# Patient Record
Sex: Female | Born: 1985 | Race: White | Hispanic: No | Marital: Single | Smoking: Never smoker
Health system: Southern US, Community
[De-identification: ages and names within clinical notes are randomized; demographics above are authoritative.]

## PROBLEM LIST (undated history)

## (undated) DIAGNOSIS — N3010 Interstitial cystitis (chronic) without hematuria: Secondary | ICD-10-CM

## (undated) HISTORY — PX: CYSTOSCOPY: SUR368

---
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
## 2003-09-07 ENCOUNTER — Other Ambulatory Visit: Admission: RE | Admit: 2003-09-07 | Discharge: 2003-09-07 | Payer: Self-pay | Admitting: Family Medicine

## 2006-01-21 ENCOUNTER — Ambulatory Visit: Payer: Self-pay | Admitting: Internal Medicine

## 2007-02-03 ENCOUNTER — Encounter: Admission: RE | Admit: 2007-02-03 | Discharge: 2007-02-03 | Payer: Self-pay

## 2007-09-05 ENCOUNTER — Ambulatory Visit (HOSPITAL_BASED_OUTPATIENT_CLINIC_OR_DEPARTMENT_OTHER): Admission: RE | Admit: 2007-09-05 | Discharge: 2007-09-05 | Payer: Self-pay | Admitting: Urology

## 2007-09-05 ENCOUNTER — Encounter (INDEPENDENT_AMBULATORY_CARE_PROVIDER_SITE_OTHER): Payer: Self-pay | Admitting: Urology

## 2010-07-10 ENCOUNTER — Encounter (INDEPENDENT_AMBULATORY_CARE_PROVIDER_SITE_OTHER): Payer: Self-pay | Admitting: *Deleted

## 2010-07-17 ENCOUNTER — Encounter (INDEPENDENT_AMBULATORY_CARE_PROVIDER_SITE_OTHER): Payer: Self-pay | Admitting: *Deleted

## 2010-07-17 ENCOUNTER — Ambulatory Visit: Payer: Self-pay | Admitting: Internal Medicine

## 2010-07-17 DIAGNOSIS — N3010 Interstitial cystitis (chronic) without hematuria: Secondary | ICD-10-CM

## 2010-07-17 DIAGNOSIS — K5900 Constipation, unspecified: Secondary | ICD-10-CM | POA: Insufficient documentation

## 2010-07-17 DIAGNOSIS — K6289 Other specified diseases of anus and rectum: Secondary | ICD-10-CM

## 2010-11-28 NOTE — Assessment & Plan Note (Signed)
Summary: RECTAL PAIN...AS.   History of Present Illness Visit Type: Follow-up Visit Primary GI MD: Stan Head MD [facility] Chief Complaint: Ongoing rectal pain and constipation with hard stools. Pt denies any bleeding. Pt has increased fiber in her diet.  History of Present Illness:   25 yo Asian woman. Previously seen for IBS-like problems in 2007. She saw Dr. Loreta Ave in July, had a very painful rectal exam, discussed diet. She was prescribed diltiazem gel 2% but not helpful at all and stopped after 2 weeks. She increased fiber and fluid but has not been perfectly compliant and still gets constipated some. She has painful defecation, sometimes sharp, sometines contraction and pressure of anorectum. Sitting down can be painful. She will see some thin filmy fluid discharge in underwear and can feel it at times. No bleeding. It is not disturbing sleep. She defecates every other day, sometinmes hard and sometimes not. 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 Ongoing rectal pain and constipation with hard stools. Pt denies any bleeding. Pt has increased fiber in her diet.    GI Review of Systems    Reports belching.      Denies abdominal pain, acid reflux, bloating, chest pain, dysphagia with liquids, dysphagia with solids, heartburn, loss of appetite, nausea, vomiting, vomiting blood, weight loss, and  weight gain.      Reports black tarry stools, constipation, hemorrhoids, and  rectal pain.     Denies anal fissure, change in bowel habit, diarrhea, diverticulosis, fecal incontinence, heme positive stool, irritable bowel syndrome, jaundice, light color stool, liver problems, and  rectal bleeding. Preventive Screening-Counseling & Management  Alcohol-Tobacco     Smoking Status: never      Drug Use:  no.      Current Medications (verified): 1)  Multivitamins   Tabs (Multiple Vitamin) .... One Tablet By Mouth Once Daily 2)  Fish Oil 1000 Mg Caps (Omega-3 Fatty  Acids) .... 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  Alcohol Use - no Illicit Drug Use - no Smoking Status:  never Drug Use:  no  Review of Systems       The patient complains of urination - excessive.  The patient denies allergy/sinus, anemia, anxiety-new, arthritis/joint pain, back pain, blood in urine, breast changes/lumps, change in vision, confusion, cough, coughing up blood, depression-new, fainting, fatigue, fever, headaches-new, hearing problems, heart murmur, heart rhythm changes, itching, menstrual pain, muscle pains/cramps, night sweats, nosebleeds, pregnancy symptoms, shortness of breath, skin rash, sleeping problems, sore throat, swelling of feet/legs, swollen lymph glands, thirst - excessive , urination - excessive , urination changes/pain, urine leakage, vision changes, and voice change.         She manages IC with with diet restriction and is having a little flare at this time.  Vital Signs:  Patient profile:   25 year old female Height:      63 inches Weight:      103.13 pounds BMI:     18.33 Pulse rate:   80 / minute Pulse rhythm:   regular BP sitting:   116 / 68  (right arm) Cuff size:   regular  Vitals Entered By: Christie Nottingham CMA Duncan Dull) (July 17, 2010 9:03 AM)  Physical Exam  General:  Well developed, well nourished, no acute distress. Eyes:  PERRLA, no icterus. Mouth:  No deformity or lesions, dentition normal. Neck:  Supple; no masses or thyromegaly. Lungs:  Clear throughout to auscultation. Heart:  Regular rate and rhythm; no murmurs, rubs,  or bruits. Rectal:  Femal staff present tiny pile anterior diffusely tender (full circumference) but mild-moderate, able to  insert 5th digit but did not tolerate index finger  no perianal dermatitis Extremities:  No clubbing, cyanosis, edema or deformities noted. Neurologic:  Alert and  oriented x3 Psych:  Alert and cooperative. Normal mood and affect.   Impression & Recommendations:  Problem # 1:  RECTAL PAIN (ZOX-096.04) Assessment New Does not sound like a severe fissure but is tender and has small anteror pile on exam. Discharge raises ? of proctitis. Plan for flex sig to assess and have a clearer dx. ? if some relationship to interstitial cystitis (doubt but could be possible). Obtain records from Dr. Loreta Ave re: this summers evaluatuon I believe she was gien good advice about increasing fiber and this was reinforced. Seems llike she does improve with fber but is not totally compliant.  Orders: Flex with Sedation (Flex w/Sed)  Problem # 2:  CONSTIPATION (ICD-564.00) Assessment: Deteriorated increase fiber as told by Dr. Loreta Ave patient says TSH was ok in July'IBS most likely, especially with concomitant IC (may need to be seen for that again)  Problem # 3:  INTERSTITIAL CYSTITIS (ICD-595.1) Assessment: New she did not have this when I saw her in 2007 No complaints consistent with fibromyalgia  Patient Instructions: 1)  We will see you at your procedure on 08/10/10 2)  Please take your Metamucil regularly 3)  We will obtain your records from Dr. Kenna Gilbert office. 4)  [facility] Patient Information Guide given to patient.  5)  Colonoscopy and Flexible Sigmoidoscopy brochure given.  6)  The medication list was reviewed and reconciled.  All changed / newly prescribed medications were explained.  A complete medication list was provided to the patient / caregiver.  Appended Document: RECTAL PAIN...AS.   Social History: Sales promotion account executive [employer] Patient has never smoked.  Alcohol Use - no Illicit Drug Use - no

## 2010-11-28 NOTE — Letter (Signed)
Summary: [facility]  [address] [place], [place] [address]   Phone: [phone]  Fax: [phone]       Rachel Beltran    05/30/1986    MRN: [id number]        Procedure Day Dorna Bloom: Lenor Coffin, 08/10/10     Arrival Time: 1:30 PM      Procedure Time: 2:30 PM    Location of Procedure:                    _X_  [facility] (4th Floor)  PREPARATION FOR FLEXIBLE SIGMOIDOSCOPY WITH FLEETS ENEMA  Prior to the day before your procedure, purchase one Fleet Enema from the laxative section of your drugstore.  _________________________________________________________________________________________________  THE DAY BEFORE YOUR PROCEDURE             WEDNESDAY, 08/09/10  1.   Have a clear liquid dinner the night before your procedure.  2.   Do not drink anything colored red or purple.  Avoid juices with pulp.  No orange juice.              CLEAR LIQUIDS INCLUDE: Water Jello Ice Popsicles Tea (sugar ok, no milk/cream) Powdered fruit flavored drinks Coffee (sugar ok, no milk/cream) Gatorade Juice: apple, white grape, white cranberry  Lemonade Clear bullion, consomm, broth Carbonated beverages (any kind) Strained chicken noodle soup Hard Candy  _________________________________________________________________________________________________  THE DAY OF YOUR PROCEDURE            THURSDAY, 08/10/10  1.   Use Fleet Enema one hour prior to coming for procedure.  2.   You may drink clear liquids until 12:30 PM (2 hours before exam)   MEDICATION INSTRUCTIONS  Unless otherwise instructed, you should take regular prescription medications with a small sip of water as early as possible the morning of your procedure.            OTHER INSTRUCTIONS  You will need a responsible adult at least 27 years of age to accompany you and drive you home.   This person must remain in the waiting room during your procedure.  Wear loose fitting  clothing that is easily removed.  Leave jewelry and other valuables at home.  However, you may wish to bring a book to read or an iPod/MP3 player to listen to music as you wait for your procedure to start.  Remove all body piercing jewelry and leave at home.  Total time from sign-in until discharge is approximately 2-3 hours.  You should go home directly after your procedure and rest.  You can resume normal activities the day after your procedure.  The day of your procedure you should not:   Drive   Make legal decisions   Operate machinery   Drink alcohol   Return to work  You will receive specific instructions about eating, activities and medications before you leave.   The above instructions have been reviewed and explained to me by   Francee Piccolo, CMA (AAMA)      I fully understand and can verbalize these instructions _______________________ Date July 17, 2010

## 2010-11-28 NOTE — Progress Notes (Signed)
Summary: [facility]  [facility]   Imported By: Lanelle Bal 08/11/2010 10:26:17  _____________________________________________________________________  External Attachment:    Type:   Image     Comment:   External Document

## 2010-11-28 NOTE — Letter (Signed)
Summary: New Patient letter  [facility]  [address] [place], [place] [address]   Phone: [phone]  Fax: [phone]       07/10/2010 MRN: [id number]  Rachel Beltran [address] [place], [place]  [address]  Dear Ms. Rachel Beltran,  Welcome to the Gastroenterology Division at [facility].    You are scheduled to see Dr. Leone Payor on 07/17/2010 at 9:00AM on the 3rd floor at [facility], [address]  We ask that you try to arrive at our office 15 minutes prior to your appointment time to allow for check-in.  We would like you to complete the enclosed self-administered evaluation form prior to your visit and bring it with you on the day of your appointment.  We will review it with you.  Also, please bring a complete list of all your medications or, if you prefer, bring the medication bottles and we will list them.  Please bring your insurance card so that we may make a copy of it.  If your insurance requires a referral to see a specialist, please bring your referral form from your primary care physician.  Co-payments are due at the time of your visit and may be paid by cash, check or credit card.     Your office visit will consist of a consult with your physician (includes a physical exam), any laboratory testing he/she may order, scheduling of any necessary diagnostic testing (e.g. x-ray, ultrasound, CT-scan), and scheduling of a procedure (e.g. Endoscopy, Colonoscopy) if required.  Please allow enough time on your schedule to allow for any/all of these possibilities.    If you cannot keep your appointment, please call [phone] to cancel or reschedule prior to your appointment date.  This allows [place] the opportunity to schedule an appointment for another patient in need of care.  If you do not cancel or reschedule by 5 p.m. the business day prior to your appointment date, you will be charged a $50.00 late cancellation/no-show fee.    Thank you  for choosing [facility] for your medical needs.  We appreciate the opportunity to care for you.  Please visit [place] at our website  to learn more about our practice.                     Sincerely,                                                             The Gastroenterology Division

## 2011-03-13 NOTE — Op Note (Signed)
[name], [name]            ACCOUNT NO.:  [id number]   MEDICAL RECORD NO.:  [id number]          PATIENT TYPE:  AMB   LOCATION:  [facility]                         FACILITY:  [facility]   PHYSICIAN:  Ronald L. Earlene Plater, M.D.  DATE OF BIRTH:  [birth year]   DATE OF PROCEDURE:  09/05/2007  DATE OF DISCHARGE:                               OPERATIVE REPORT   DIAGNOSIS:  Questionable interstitial cystitis.   OPERATIVE PROCEDURES:  1. Cystourethroscopy.  2. Dilatation of urethra.  3. Hydraulic bladder distention.  4. Bladder biopsy.   SURGEON:  Rachel Beltran. Earlene Plater, M.D.   ANESTHESIA:  LMA.   BLOOD LOSS:  Negligible.   TUBES:  None.   COMPLICATIONS:  None.   INDICATIONS FOR PROCEDURE:  Rachel Beltran is a lovely 25 year old [place]  female who presents with suprapubic pain, urgency, frequency, suprapubic  pressure relieved by voiding, and dyspareunia.  She notes that it has  been chronic in nature and has persisted.  She notes that over the last  3 months it has progressively worsened some and now the pain is  radiating to her lower pelvis.  She has been treated with antibiotics  that has helped slightly and the symptoms have been variable.  After  understanding risks, benefits and alternatives, she has elected to  proceed with the above procedure for diagnostic and therapeutic  purposes.   PROCEDURE IN DETAIL:  The patient was placed in supine position, after  proper LMA anesthesia was placed in the dorsal lithotomy position.  Her  urethra was noted to be essentially pinhole, and that was carefully  dilated with female sounds to 26-French.  Cystourethroscopy was then  performed with a 22.5-French Olympus panendoscope.  Utilizing the 12 and  70-degree lenses, the bladder was carefully inspected and noted to be  without lesions.  Efflux of clear urine was noted from the normally-  placed ureteral orifices bilaterally.  Hydraulic bladder distention was  then performed to 80 cmH2O and she had a 350-mL  capacity bladder and  once this was drained, there were diffuse submucosal glomerulations.  In  the posterior wall there were actually bleeding spots consistent with  interstitial cystitis.  A biopsy was obtained with the cold cup biopsy  forceps in the posterior midline and the base was cauterized with Bugbee  coagulation cautery and submitted to pathology.  There were no other  lesions noted.  The bladder was drained, the panendoscope was removed  and the patient was taken to the recovery room stable.   FINAL PROCEDURES:  1. Cystourethroscopy.  2. Hydraulic bladder distention.  3. Bladder biopsy.  4. Urethral dilatation.      Ronald L. Earlene Plater, M.D.  Electronically Signed     RLD/MEDQ  D:  09/05/2007  T:  09/05/2007  Job:  [id number]

## 2011-07-13 ENCOUNTER — Other Ambulatory Visit: Payer: Self-pay

## 2011-08-07 LAB — POCT PREGNANCY, URINE: Preg Test, Ur: NEGATIVE

## 2011-11-23 ENCOUNTER — Emergency Department (INDEPENDENT_AMBULATORY_CARE_PROVIDER_SITE_OTHER): Payer: Self-pay

## 2011-11-23 ENCOUNTER — Emergency Department (HOSPITAL_BASED_OUTPATIENT_CLINIC_OR_DEPARTMENT_OTHER)
Admission: EM | Admit: 2011-11-23 | Discharge: 2011-11-23 | Disposition: A | Discharge status: Home / Self Care | Payer: Self-pay | Attending: Emergency Medicine | Admitting: Emergency Medicine

## 2011-11-23 ENCOUNTER — Encounter (HOSPITAL_BASED_OUTPATIENT_CLINIC_OR_DEPARTMENT_OTHER): Payer: Self-pay | Admitting: Emergency Medicine

## 2011-11-23 DIAGNOSIS — N201 Calculus of ureter: Secondary | ICD-10-CM

## 2011-11-23 DIAGNOSIS — N1330 Unspecified hydronephrosis: Secondary | ICD-10-CM

## 2011-11-23 DIAGNOSIS — R109 Unspecified abdominal pain: Secondary | ICD-10-CM

## 2011-11-23 DIAGNOSIS — N23 Unspecified renal colic: Secondary | ICD-10-CM | POA: Insufficient documentation

## 2011-11-23 HISTORY — DX: Interstitial cystitis (chronic) without hematuria: N30.10

## 2011-11-23 LAB — URINALYSIS, ROUTINE W REFLEX MICROSCOPIC
Bilirubin Urine: NEGATIVE
Glucose, UA: NEGATIVE mg/dL
Ketones, ur: 40 mg/dL — AB
Protein, ur: 30 mg/dL — AB
Specific Gravity, Urine: 1.028 (ref 1.005–1.030)
Urobilinogen, UA: 0.2 mg/dL (ref 0.0–1.0)
pH: 5.5 (ref 5.0–8.0)

## 2011-11-23 LAB — URINE MICROSCOPIC-ADD ON

## 2011-11-23 MED ORDER — ONDANSETRON 8 MG PO TBDP: 4.0000 mg | ORAL_TABLET | Freq: Three times a day (TID) | ORAL | Status: AC | PRN

## 2011-11-23 MED ORDER — HYDROMORPHONE HCL 2 MG PO TABS: 2.0000 mg | ORAL_TABLET | ORAL | Status: AC | PRN

## 2011-11-23 MED ORDER — SODIUM CHLORIDE 0.9 % IV SOLN
INTRAVENOUS | Status: DC
  Administered 2011-11-23: 06:00:00 via INTRAVENOUS

## 2011-11-23 MED ORDER — FENTANYL CITRATE 0.05 MG/ML IJ SOLN
50.0000 ug | Freq: Once | INTRAMUSCULAR | Status: AC
  Administered 2011-11-23: 50 ug via INTRAVENOUS
  Filled 2011-11-23: qty 2

## 2011-11-23 MED ORDER — NAPROXEN SODIUM 220 MG PO TABS: ORAL_TABLET | ORAL | Status: AC

## 2011-11-23 MED ORDER — TAMSULOSIN HCL 0.4 MG PO CAPS: ORAL_CAPSULE | ORAL | Status: AC

## 2011-11-23 MED ORDER — IBUPROFEN 800 MG PO TABS
800.0000 mg | ORAL_TABLET | Freq: Once | ORAL | Status: AC
  Administered 2011-11-23: 800 mg via ORAL
  Filled 2011-11-23: qty 1

## 2011-11-23 MED ORDER — ONDANSETRON HCL 4 MG/2ML IJ SOLN
4.0000 mg | Freq: Once | INTRAMUSCULAR | Status: AC
  Administered 2011-11-23: 4 mg via INTRAVENOUS
  Filled 2011-11-23: qty 2

## 2011-11-23 NOTE — ED Notes (Signed)
Pt c/o left flank pain x 2 days worsening this morning with nausea onset this morning.

## 2011-11-23 NOTE — ED Provider Notes (Signed)
History     CSN: [id number]  Arrival date & time 11/23/11  4098   First MD Initiated Contact with Patient 11/23/11 [phone]      Chief Complaint  Patient presents with  . Flank Pain    (Consider location/radiation/quality/duration/timing/severity/associated sxs/prior treatment) HPI This is a 26 year old female with a history of interstitial cystitis. She complains of left flank pain that began about 2 days ago. The onset was mild and transient. It responded well to ibuprofen. The pain worsened and became moderate to severe yesterday evening about 10 PM. This morning the pain became severe and she was brought to the ED. She had some transient nausea and near syncope when the pain was at its worst. The pain waxes and wanes and is not as severe as it was earlier. She has not had any fever, chills, dysuria or hematuria. The pain in her left flank now radiates to the left lower cord the abdomen. The pain is worse with movement, particularly movement of the left hip.  Past Medical History  Diagnosis Date  . Interstitial cystitis     Past Surgical History  Procedure Date  . Cystoscopy     No family history on file.  History  Substance Use Topics  . Smoking status: Never Smoker   . Smokeless tobacco: Not on file  . Alcohol Use: No    OB History    Grav Para Term Preterm Abortions TAB SAB Ect Mult Living                  Review of Systems  All other systems reviewed and are negative.    Allergies  Review of patient's allergies indicates no known allergies.  Home Medications  No current outpatient prescriptions on file.  BP 101/48  Pulse 70  Temp(Src) 97.3 F (36.3 C) (Oral)  Resp 18  SpO2 100%  LMP 10/27/2011  Physical Exam General: Well-developed, well-nourished female in no acute distress; appearance consistent with age of record HENT: normocephalic, atraumatic Eyes: pupils equal round and reactive to light; extraocular muscles intact Neck: supple Heart:  regular rate and rhythm; no murmurs, rubs or gallops Lungs: clear to auscultation bilaterally Abdomen: soft; mild left lower quadrant and left suprapubic tenderness; nontender; no masses or hepatosplenomegaly; bowel sounds present GU: Left CVA tenderness; urine cloudy Extremities: No deformity; full range of motion Neurologic: Awake, alert and oriented; motor function intact in all extremities and symmetric; no facial droop Skin: Warm and dry; facial acne Psychiatric: Normal mood and affect    ED Course  Procedures (including critical care time)     MDM   Nursing notes and vitals signs, including pulse oximetry, reviewed.  Summary of this visit's results, reviewed by myself:  Labs:  Results for orders placed during the hospital encounter of 11/23/11  URINALYSIS, ROUTINE W REFLEX MICROSCOPIC      Component Value Range   Color, Urine YELLOW  YELLOW    APPearance CLOUDY (*) CLEAR    Specific Gravity, Urine 1.028  1.005 - 1.030    pH 5.5  5.0 - 8.0    Glucose, UA NEGATIVE  NEGATIVE (mg/dL)   Hgb urine dipstick LARGE (*) NEGATIVE    Bilirubin Urine NEGATIVE  NEGATIVE    Ketones, ur 40 (*) NEGATIVE (mg/dL)   Protein, ur 30 (*) NEGATIVE (mg/dL)   Urobilinogen, UA 0.2  0.0 - 1.0 (mg/dL)   Nitrite NEGATIVE  NEGATIVE    Leukocytes, UA SMALL (*) NEGATIVE   PREGNANCY, URINE  Component Value Range   Preg Test, Ur NEGATIVE  NEGATIVE   URINE MICROSCOPIC-ADD ON      Component Value Range   Squamous Epithelial / LPF MANY (*) RARE    WBC, UA 3-6  <3 (WBC/hpf)   RBC / HPF 21-50  <3 (RBC/hpf)   Bacteria, UA MANY (*) RARE     Imaging Studies: Ct Abdomen Pelvis Wo Contrast  11/23/2011  *RADIOLOGY REPORT*  Clinical Data: Mild flank pain.  CT ABDOMEN AND PELVIS WITHOUT CONTRAST  Technique:  Multidetector CT imaging of the abdomen and pelvis was performed following the standard protocol without intravenous contrast.  Comparison: Abdominal radiograph performed 09/05/2007, and abdominal  and pelvic ultrasound performed 02/03/2007  Findings: The visualized lung bases are clear.  The liver and spleen are unremarkable in appearance.  The gallbladder is within normal limits.  The pancreas and adrenal glands are unremarkable.  There is very mild left-sided hydronephrosis, with an obstructing 6 x 4 mm stone noted within the proximal left ureter, 2-3 cm below the left renal pelvis.  Minimally increased attenuation within the right renal pyramids is nonspecific in appearance.  No nonobstructing renal stones are identified.  No perinephric stranding is seen.  The right kidney is otherwise unremarkable in appearance.  No free fluid is identified.  The small bowel is unremarkable in appearance.  The stomach is within normal limits.  No acute vascular abnormalities are seen.  The appendix is normal in caliber and contains air, extending toward the midline.  There is no evidence for appendicitis.  The colon is unremarkable in appearance.  The bladder is decompressed and not well assessed.  The uterus is grossly unremarkable in appearance.  The ovaries are within normal limits; the left ovary is slightly larger than the right.  No suspicious adnexal masses are seen.  No inguinal lymphadenopathy is seen.  No acute osseous abnormalities are identified.  IMPRESSION: Very mild left-sided hydronephrosis, with an obstructing 6 x 4 mm stone in the proximal left ureter, 2-3 cm below the left renal pelvis.  Original Report Authenticated By: Tonia Ghent, M.D.   7:03 AM Pain improved with IV medication. The patient's care was discussed with Dr. Vernie Ammons, who will arrange for followup care at [facility].        Hanley Seamen, MD 11/23/11 [phone]

## 2011-11-24 LAB — URINE CULTURE

## 2012-08-19 IMAGING — CT CT ABD-PELV W/O CM
2 of 4 series · 15 of 46 positions shown, 17 images · non-contrast
Comparison: Abdominal radiograph performed 09/05/2007, and
abdominal and pelvic ultrasound performed 02/03/2007

CLINICAL DATA: Mild flank pain.

CT ABDOMEN AND PELVIS WITHOUT CONTRAST
TECHNIQUE: Multidetector CT imaging of the abdomen and pelvis was
performed following the standard protocol without intravenous
contrast.

[Series 2: renal stone < 200 lbs 5.0 b31f · axial · 0.66mm/px · z∈[-470,-90]mm · 12 of 84 slices shown, 14 images]
[im 4/84  soft-tissue]
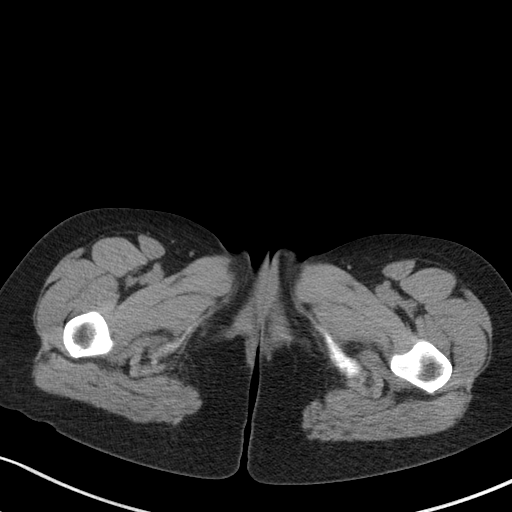
[im 4/84  bone]
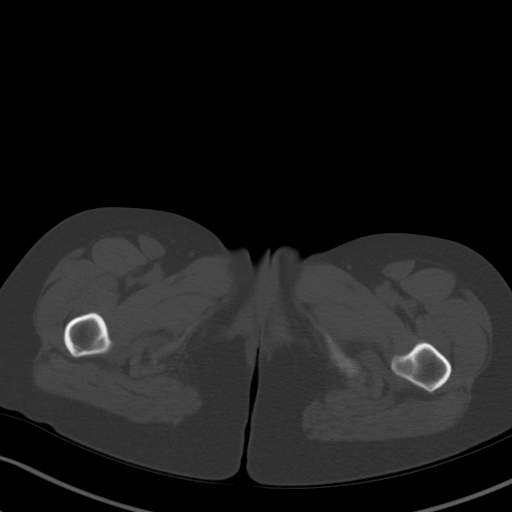
[im 11/84  soft-tissue]
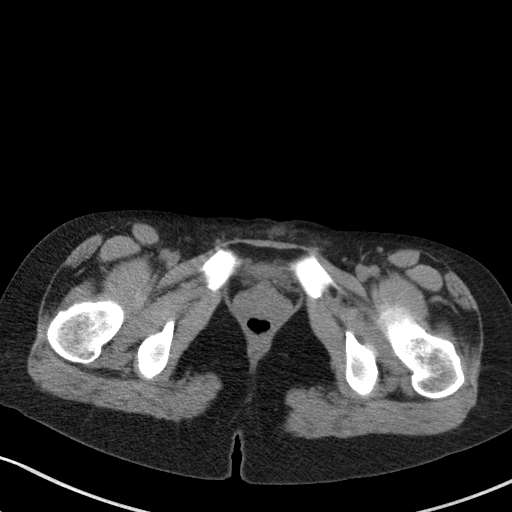
[im 18/84  soft-tissue]
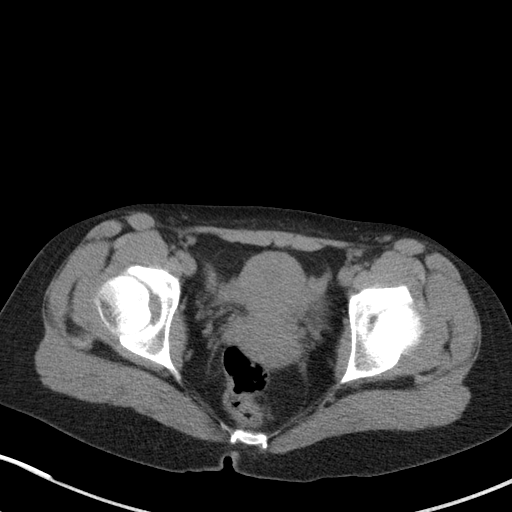
[im 25/84  soft-tissue]
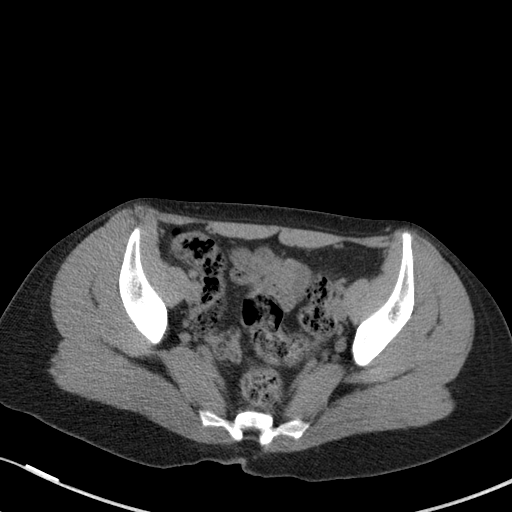
[im 32/84  soft-tissue]
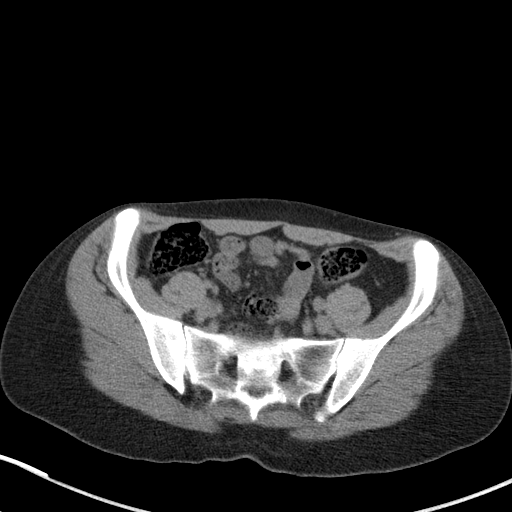
[im 39/84  soft-tissue]
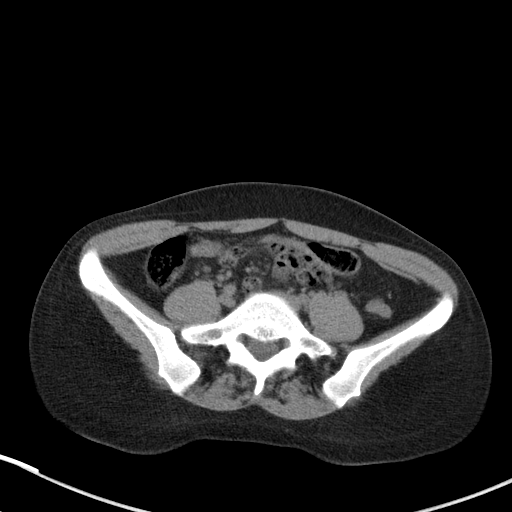
[im 45/84  soft-tissue]
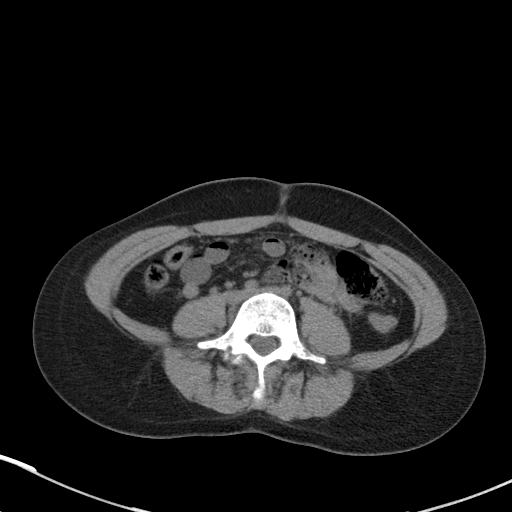
[im 52/84  soft-tissue]
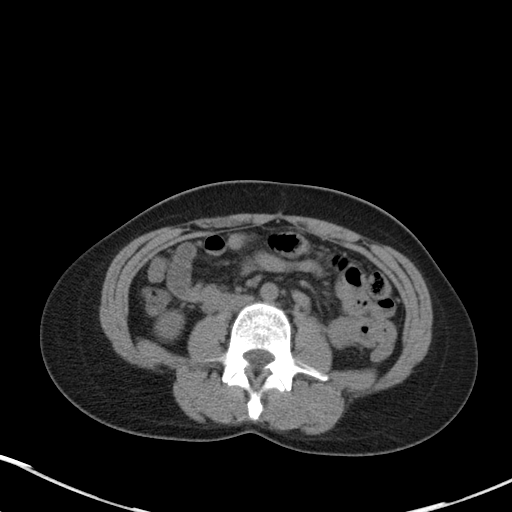
[im 59/84  soft-tissue]
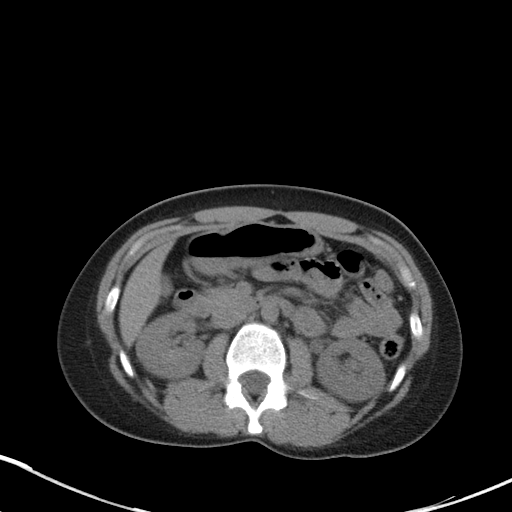
[im 59/84  bone]
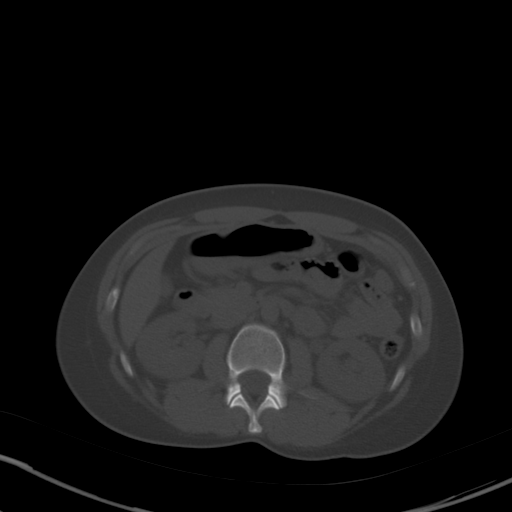
[im 66/84  soft-tissue]
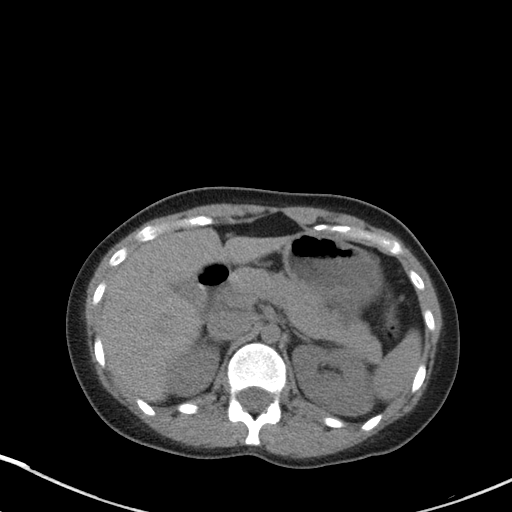
[im 73/84  soft-tissue]
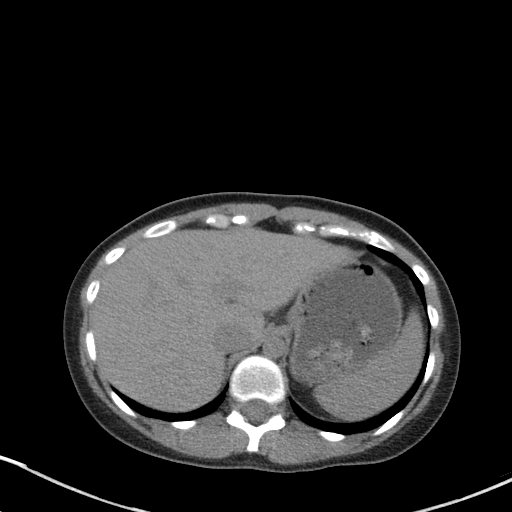
[im 80/84  soft-tissue]
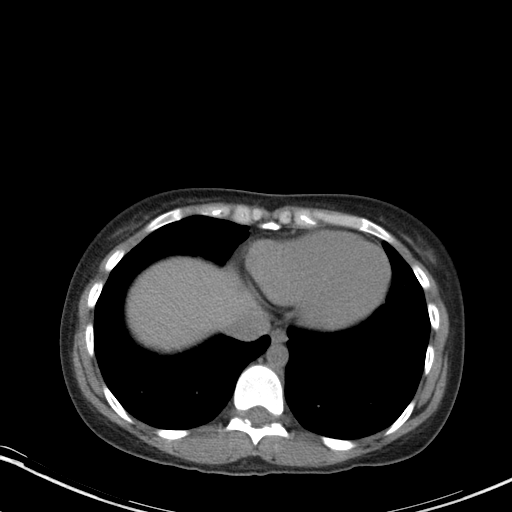

[Series 5: renal stone 3.0 coronal · coronal · 0.57mm/px · 3 of 64 slices shown]
[im 22/64  soft-tissue]
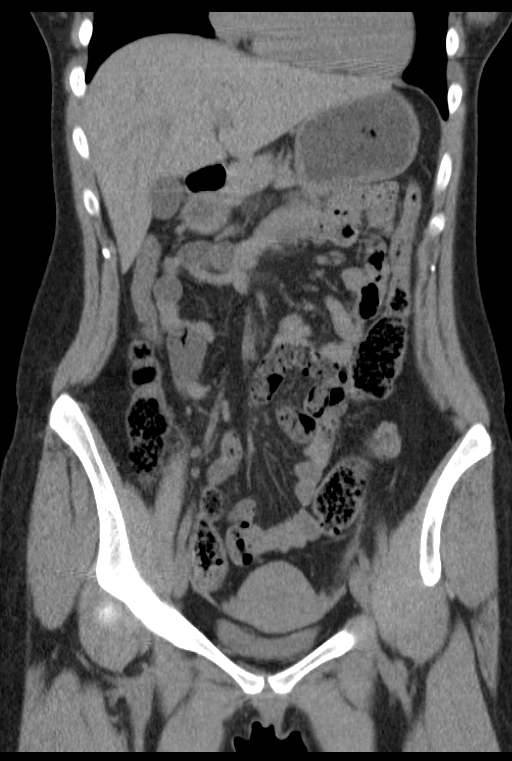
[im 29/64  soft-tissue]
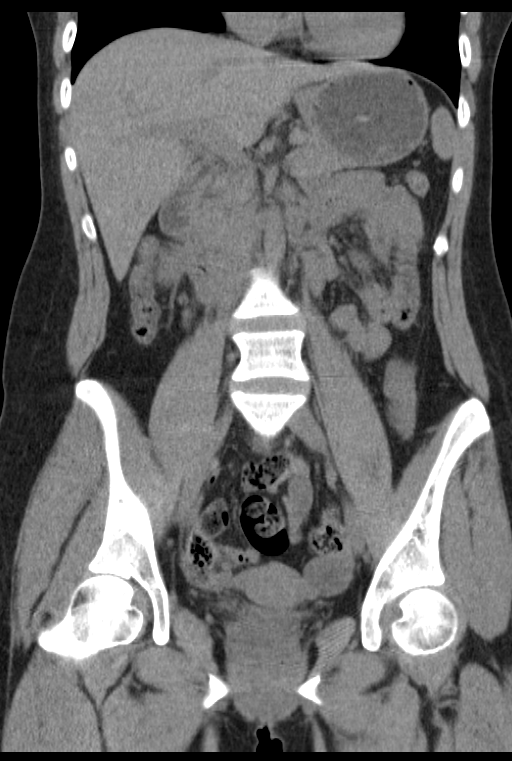
[im 36/64  soft-tissue]
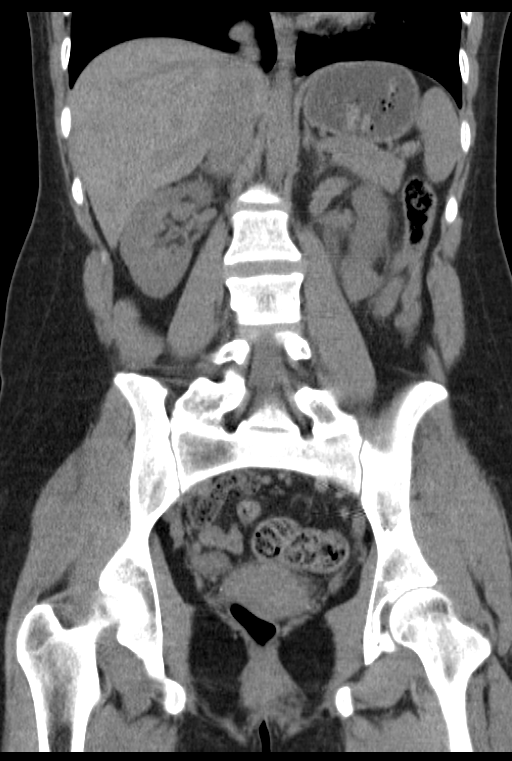

[15 of 46 positions shown; findings below may reference images not displayed]

FINDINGS: The visualized lung bases are clear.

The liver and spleen are unremarkable in appearance.  The
gallbladder is within normal limits.  The pancreas and adrenal
glands are unremarkable.

There is very mild left-sided hydronephrosis, with an obstructing 6
x 4 mm stone noted within the proximal left ureter, 2-3 cm below
the left renal pelvis.

Minimally increased attenuation within the right renal pyramids is
nonspecific in appearance.  No nonobstructing renal stones are
identified.  No perinephric stranding is seen.  The right kidney is
otherwise unremarkable in appearance.

No free fluid is identified.  The small bowel is unremarkable in
appearance.  The stomach is within normal limits.  No acute
vascular abnormalities are seen.

The appendix is normal in caliber and contains air, extending
toward the midline.  There is no evidence for appendicitis.  The
colon is unremarkable in appearance.

The bladder is decompressed and not well assessed.  The uterus is
grossly unremarkable in appearance.  The ovaries are within normal
limits; the left ovary is slightly larger than the right.  No
suspicious adnexal masses are seen.  No inguinal lymphadenopathy is
seen.

No acute osseous abnormalities are identified.
IMPRESSION: Very mild left-sided hydronephrosis, with an obstructing 6 x 4 mm
stone in the proximal left ureter, 2-3 cm below the left renal
pelvis.

## 2020-03-07 ENCOUNTER — Telehealth: Payer: Self-pay | Admitting: Radiation Oncology

## 2020-03-07 NOTE — Telephone Encounter (Signed)
Patient left message that her father is having a radioactive seed implant tomorrow and she wants to know if there are any restrictions. Left a detailed message on the number she provided [phone]) explaining the following.   PERSONS AGE 36-45 (if able to become pregnant)  FOR 8 WEEKS FOLLOWING IMPLANT  At a distance of 1 foot: limit time to less than 2 hours/week At a distance of 3 feet: limit time to 20 hours/week At a distance of 6 feet: no restrictions  AFTER 8 WEEKS  No restrictions

## 2020-03-08 NOTE — Telephone Encounter (Signed)
Opened in error
